# Patient Record
Sex: Female | Born: 1990 | Race: Black or African American | Hispanic: No | Marital: Single | Smoking: Never smoker
Health system: Southern US, Community
[De-identification: ages and names within clinical notes are randomized; demographics above are authoritative.]

## PROBLEM LIST (undated history)

## (undated) ENCOUNTER — Inpatient Hospital Stay (HOSPITAL_COMMUNITY): Payer: Self-pay

## (undated) DIAGNOSIS — C801 Malignant (primary) neoplasm, unspecified: Secondary | ICD-10-CM

---

## 2016-10-30 ENCOUNTER — Emergency Department (HOSPITAL_BASED_OUTPATIENT_CLINIC_OR_DEPARTMENT_OTHER)
Admission: EM | Admit: 2016-10-30 | Discharge: 2016-10-30 | Disposition: A | Discharge status: Home / Self Care | Payer: Medicaid Other | Attending: Emergency Medicine | Admitting: Emergency Medicine

## 2016-10-30 ENCOUNTER — Emergency Department (HOSPITAL_BASED_OUTPATIENT_CLINIC_OR_DEPARTMENT_OTHER): Payer: Medicaid Other

## 2016-10-30 ENCOUNTER — Encounter (HOSPITAL_BASED_OUTPATIENT_CLINIC_OR_DEPARTMENT_OTHER): Payer: Self-pay | Admitting: *Deleted

## 2016-10-30 DIAGNOSIS — O26892 Other specified pregnancy related conditions, second trimester: Secondary | ICD-10-CM | POA: Insufficient documentation

## 2016-10-30 DIAGNOSIS — Z859 Personal history of malignant neoplasm, unspecified: Secondary | ICD-10-CM | POA: Insufficient documentation

## 2016-10-30 DIAGNOSIS — R1030 Lower abdominal pain, unspecified: Secondary | ICD-10-CM | POA: Insufficient documentation

## 2016-10-30 DIAGNOSIS — Z3A22 22 weeks gestation of pregnancy: Secondary | ICD-10-CM | POA: Insufficient documentation

## 2016-10-30 DIAGNOSIS — N898 Other specified noninflammatory disorders of vagina: Secondary | ICD-10-CM | POA: Diagnosis not present

## 2016-10-30 HISTORY — DX: Malignant (primary) neoplasm, unspecified: C80.1

## 2016-10-30 LAB — CBC WITH DIFFERENTIAL/PLATELET
BASOS PCT: 0 %
Basophils Absolute: 0 10*3/uL (ref 0.0–0.1)
Eosinophils Absolute: 0.1 10*3/uL (ref 0.0–0.7)
Eosinophils Relative: 2 %
HEMATOCRIT: 35.4 % — AB (ref 36.0–46.0)
Hemoglobin: 12.3 g/dL (ref 12.0–15.0)
LYMPHS ABS: 1.4 10*3/uL (ref 0.7–4.0)
Lymphocytes Relative: 16 %
MCH: 31 pg (ref 26.0–34.0)
MCHC: 34.7 g/dL (ref 30.0–36.0)
MCV: 89.2 fL (ref 78.0–100.0)
MONO ABS: 0.6 10*3/uL (ref 0.1–1.0)
MONOS PCT: 8 %
NEUTROS ABS: 6.2 10*3/uL (ref 1.7–7.7)
Neutrophils Relative %: 74 %
Platelets: 263 10*3/uL (ref 150–400)
RBC: 3.97 MIL/uL (ref 3.87–5.11)
RDW: 12.9 % (ref 11.5–15.5)
WBC: 8.4 10*3/uL (ref 4.0–10.5)

## 2016-10-30 LAB — URINALYSIS, ROUTINE W REFLEX MICROSCOPIC
Bilirubin Urine: NEGATIVE
GLUCOSE, UA: NEGATIVE mg/dL
HGB URINE DIPSTICK: NEGATIVE
KETONES UR: NEGATIVE mg/dL
Leukocytes, UA: NEGATIVE
Nitrite: NEGATIVE
PH: 6 (ref 5.0–8.0)
Protein, ur: NEGATIVE mg/dL
Specific Gravity, Urine: 1.02 (ref 1.005–1.030)

## 2016-10-30 LAB — COMPREHENSIVE METABOLIC PANEL
ALK PHOS: 49 U/L (ref 38–126)
ALT: 11 U/L — AB (ref 14–54)
ANION GAP: 6 (ref 5–15)
AST: 20 U/L (ref 15–41)
Albumin: 3.6 g/dL (ref 3.5–5.0)
BILIRUBIN TOTAL: 0.4 mg/dL (ref 0.3–1.2)
BUN: <5 mg/dL — ABNORMAL LOW (ref 6–20)
CALCIUM: 8.7 mg/dL — AB (ref 8.9–10.3)
CO2: 21 mmol/L — ABNORMAL LOW (ref 22–32)
CREATININE: 0.49 mg/dL (ref 0.44–1.00)
Chloride: 107 mmol/L (ref 101–111)
Glucose, Bld: 83 mg/dL (ref 65–99)
Potassium: 3.4 mmol/L — ABNORMAL LOW (ref 3.5–5.1)
Sodium: 134 mmol/L — ABNORMAL LOW (ref 135–145)
TOTAL PROTEIN: 7 g/dL (ref 6.5–8.1)

## 2016-10-30 LAB — HCG, QUANTITATIVE, PREGNANCY: hCG, Beta Chain, Quant, S: 68061 m[IU]/mL — ABNORMAL HIGH (ref <5)

## 2016-10-30 NOTE — ED Notes (Signed)
Patient transported to Ultrasound 

## 2016-10-30 NOTE — Discharge Instructions (Signed)
Please read attached information regarding your condition. Follow-up at [facility] for your OB/GYN for further evaluation. Return to ED for worsening pain, vaginal bleeding, lightheadedness, loss of consciousness.

## 2016-10-30 NOTE — ED Notes (Signed)
Kathy from [facility] rapid response called and stated monitoring of mother and baby look good, no need for further monitoring at this time.

## 2016-10-30 NOTE — ED Triage Notes (Addendum)
She is [redacted] weeks pregnant. Here for abdominal cramping and leaking possible amnionic fluid since this am. She has not had prenatal care and been moving around from state to state trying to find medicaid coverage without success. G6-P3-M2

## 2016-10-30 NOTE — ED Provider Notes (Signed)
[place] DEPT [facility] Provider Note   CSN: [id number] Arrival date & time: 10/30/16  1937     History   Chief Complaint Chief Complaint  Patient presents with  . Abdominal Pain    HPI Monique Hayes is a 26 y.o. female.  HPI Patient, who is currently [redacted] weeks pregnant, presents to ED for evaluation of 1 week history of abdominal cramping. She also reports small leakage of fluid earlier today which has resolved. She states she received prenatal care about 2 months ago an ultrasound was completed there which showed intrauterine pregnancy. She also reports vaginal discharge, vaginal bleeding, nausea. She denies any dysuria, chest pain, trouble breathing, headache, vision changes, leg swelling, recent falls or injuries.  Past Medical History:  Diagnosis Date  . Cancer ([place])     There are no active problems to display for this patient.   History reviewed. No pertinent surgical history.  OB History    Gravida Para Term Preterm AB Living   1             SAB TAB Ectopic Multiple Live Births                   Home Medications    Prior to Admission medications   Not on File    Family History No family history on file.  Social History Social History  Substance Use Topics  . Smoking status: Never Smoker  . Smokeless tobacco: Never Used  . Alcohol use No     Allergies   Patient has no known allergies.   Review of Systems Review of Systems  Constitutional: Negative for appetite change, chills and fever.  HENT: Negative for ear pain, rhinorrhea, sneezing and sore throat.   Eyes: Negative for photophobia and visual disturbance.  Respiratory: Negative for cough, chest tightness, shortness of breath and wheezing.   Cardiovascular: Negative for chest pain and palpitations.  Gastrointestinal: Positive for abdominal pain, diarrhea and nausea. Negative for blood in stool, constipation and vomiting.  Genitourinary: Positive for vaginal discharge. Negative for  dysuria, enuresis, hematuria, urgency, vaginal bleeding and vaginal pain.  Musculoskeletal: Negative for myalgias.  Skin: Negative for rash.  Neurological: Negative for dizziness, weakness and light-headedness.     Physical Exam Updated Vital Signs BP 102/63 (BP Location: Left Arm)   Pulse 89   Temp 98.1 F (36.7 C) (Oral)   Resp 16   Ht 5\' 6"  (1.676 m)   Wt 86.2 kg (190 lb)   SpO2 100%   BMI 30.67 kg/m   Physical Exam  Constitutional: She appears well-developed and well-nourished. No distress.  Fetal heart rate in 140s on monitor.  HENT:  Head: Normocephalic and atraumatic.  Nose: Nose normal.  Eyes: Conjunctivae and EOM are normal. Left eye exhibits no discharge. No scleral icterus.  Neck: Normal range of motion. Neck supple.  Cardiovascular: Normal rate, regular rhythm, normal heart sounds and intact distal pulses.  Exam reveals no gallop and no friction rub.   No murmur heard. Pulmonary/Chest: Effort normal and breath sounds normal. No respiratory distress.  Abdominal: Soft. Bowel sounds are normal. She exhibits no distension. There is tenderness. There is no guarding.    Gravid uterus.  Musculoskeletal: Normal range of motion. She exhibits no edema.  Neurological: She is alert. She exhibits normal muscle tone. Coordination normal.  Skin: Skin is warm and dry. No rash noted.  Psychiatric: She has a normal mood and affect.  Nursing note and vitals reviewed.    ED  Treatments / Results  Labs (all labs ordered are listed, but only abnormal results are displayed) Labs Reviewed  HCG, QUANTITATIVE, PREGNANCY - Abnormal; Notable for the following:       Result Value   hCG, Beta Chain, Quant, S 68,061 (*)    All other components within normal limits  CBC WITH DIFFERENTIAL/PLATELET - Abnormal; Notable for the following:    HCT 35.4 (*)    All other components within normal limits  COMPREHENSIVE METABOLIC PANEL - Abnormal; Notable for the following:    Sodium 134 (*)      Potassium 3.4 (*)    CO2 21 (*)    BUN <5 (*)    Calcium 8.7 (*)    ALT 11 (*)    All other components within normal limits  URINALYSIS, ROUTINE W REFLEX MICROSCOPIC  ABO/RH    EKG  EKG Interpretation None       Radiology US Ob Limited > 14 Wks  Result Date: 10/30/2016 CLINICAL DATA:  Pregnant patient in second trimester with abdominal cramping today. EXAM: LIMITED OBSTETRIC ULTRASOUND FINDINGS: Number of Fetuses: 1 Heart Rate:  155 bpm Movement: Yes Presentation: Cephalic Placental Location: Anterior Previa: No. Amniotic Fluid (Subjective):  Within normal limits. BPD:  5.47cm 22w  5d MATERNAL FINDINGS: Cervix:  Appears closed. Uterus/Adnexae: No abnormality visualized. IMPRESSION: Single live intrauterine pregnancy gestational age [redacted] weeks 5 days, estimated date of delivery 02/27/2017. This exam is performed on an emergent basis and does not comprehensively evaluate fetal size, dating, or anatomy; follow-up complete OB US should be considered if further fetal assessment is warranted. Electronically Signed   By: Jeb Levering M.D.   On: 10/30/2016 21:59    Procedures Procedures (including critical care time)  Medications Ordered in ED Medications - No data to display   Initial Impression / Assessment and Plan / ED Course  I have reviewed the triage vital signs and the nursing notes.  Pertinent labs & imaging results that were available during my care of the patient were reviewed by me and considered in my medical decision making (see chart for details).     Patient, who is currently [redacted] weeks pregnant, presents to ED for evaluation of lower abdominal pain and slight leakage of fluids earlier today. Abdominal pain has been going on for the past week. She only received prenatal care about 2 months ago and was told she had a normal IUP. She denies any vaginal bleeding, fevers, headache, vision changes or extremity swelling. She is afebrile with normal vital signs. She is  nontoxic appearing and in no acute distress. There is a gravid uterus present. Fetal heart tones noted in 140s here in the ED on monitor. Lab work including CBC, CMP, urinalysis unremarkable. No signs of UTI or dehydration. Normal blood pressure noted here. Ultrasound done here shows normal IUP with EGA of [redacted] weeks and 5 days, no evidence of placental abruption or other abnormalities. Patient encouraged to follow-up at [facility] or her OB/GYN for further prenatal care. The patient appears stable for discharge at this time. Strict return precautions given.  Final Clinical Impressions(s) / ED Diagnoses   Final diagnoses:  [redacted] weeks gestation of pregnancy    New Prescriptions New Prescriptions   No medications on file     Delia Heady, Hershal Coria 10/31/16 0017    Charlesetta Shanks, MD 11/06/16 [phone]

## 2016-10-30 NOTE — ED Notes (Signed)
ED Provider at bedside. 

## 2016-10-31 LAB — ABO/RH: ABO/RH(D): O POS

## 2016-11-16 ENCOUNTER — Emergency Department (HOSPITAL_BASED_OUTPATIENT_CLINIC_OR_DEPARTMENT_OTHER)
Admission: EM | Admit: 2016-11-16 | Discharge: 2016-11-16 | Disposition: A | Discharge status: Home / Self Care | Payer: Medicaid Other | Attending: Physician Assistant | Admitting: Physician Assistant

## 2016-11-16 ENCOUNTER — Encounter: Payer: Self-pay | Admitting: Obstetrics and Gynecology

## 2016-11-16 ENCOUNTER — Encounter (HOSPITAL_BASED_OUTPATIENT_CLINIC_OR_DEPARTMENT_OTHER): Payer: Self-pay

## 2016-11-16 DIAGNOSIS — Z3A24 24 weeks gestation of pregnancy: Secondary | ICD-10-CM | POA: Diagnosis not present

## 2016-11-16 DIAGNOSIS — O0932 Supervision of pregnancy with insufficient antenatal care, second trimester: Secondary | ICD-10-CM | POA: Insufficient documentation

## 2016-11-16 DIAGNOSIS — O9989 Other specified diseases and conditions complicating pregnancy, childbirth and the puerperium: Secondary | ICD-10-CM | POA: Diagnosis present

## 2016-11-16 DIAGNOSIS — Z859 Personal history of malignant neoplasm, unspecified: Secondary | ICD-10-CM | POA: Insufficient documentation

## 2016-11-16 DIAGNOSIS — O23592 Infection of other part of genital tract in pregnancy, second trimester: Secondary | ICD-10-CM | POA: Diagnosis not present

## 2016-11-16 DIAGNOSIS — B9689 Other specified bacterial agents as the cause of diseases classified elsewhere: Secondary | ICD-10-CM

## 2016-11-16 DIAGNOSIS — N760 Acute vaginitis: Secondary | ICD-10-CM

## 2016-11-16 LAB — COMPREHENSIVE METABOLIC PANEL
ALK PHOS: 57 U/L (ref 38–126)
ALT: 10 U/L — AB (ref 14–54)
ANION GAP: 7 (ref 5–15)
AST: 16 U/L (ref 15–41)
Albumin: 3 g/dL — ABNORMAL LOW (ref 3.5–5.0)
BILIRUBIN TOTAL: 0.5 mg/dL (ref 0.3–1.2)
BUN: <5 mg/dL — ABNORMAL LOW (ref 6–20)
CALCIUM: 8.7 mg/dL — AB (ref 8.9–10.3)
CO2: 22 mmol/L (ref 22–32)
CREATININE: 0.42 mg/dL — AB (ref 0.44–1.00)
Chloride: 106 mmol/L (ref 101–111)
Glucose, Bld: 85 mg/dL (ref 65–99)
Potassium: 3.3 mmol/L — ABNORMAL LOW (ref 3.5–5.1)
SODIUM: 135 mmol/L (ref 135–145)
TOTAL PROTEIN: 6.5 g/dL (ref 6.5–8.1)

## 2016-11-16 LAB — URINALYSIS, ROUTINE W REFLEX MICROSCOPIC
Bilirubin Urine: NEGATIVE
Glucose, UA: NEGATIVE mg/dL
Hgb urine dipstick: NEGATIVE
Ketones, ur: NEGATIVE mg/dL
NITRITE: NEGATIVE
PH: 6 (ref 5.0–8.0)
Protein, ur: NEGATIVE mg/dL
SPECIFIC GRAVITY, URINE: 1.01 (ref 1.005–1.030)

## 2016-11-16 LAB — CBC WITH DIFFERENTIAL/PLATELET
Basophils Absolute: 0 10*3/uL (ref 0.0–0.1)
Basophils Relative: 0 %
Eosinophils Absolute: 0.1 10*3/uL (ref 0.0–0.7)
Eosinophils Relative: 1 %
HCT: 32.2 % — ABNORMAL LOW (ref 36.0–46.0)
Hemoglobin: 11.3 g/dL — ABNORMAL LOW (ref 12.0–15.0)
Lymphocytes Relative: 14 %
Lymphs Abs: 1.4 10*3/uL (ref 0.7–4.0)
MCH: 31.3 pg (ref 26.0–34.0)
MCHC: 35.1 g/dL (ref 30.0–36.0)
MCV: 89.2 fL (ref 78.0–100.0)
Monocytes Absolute: 0.8 10*3/uL (ref 0.1–1.0)
Monocytes Relative: 8 %
Neutro Abs: 7.9 10*3/uL — ABNORMAL HIGH (ref 1.7–7.7)
Neutrophils Relative %: 77 %
Platelets: 244 10*3/uL (ref 150–400)
RBC: 3.61 MIL/uL — ABNORMAL LOW (ref 3.87–5.11)
RDW: 12.8 % (ref 11.5–15.5)
WBC: 10.2 10*3/uL (ref 4.0–10.5)

## 2016-11-16 LAB — WET PREP, GENITAL
Sperm: NONE SEEN
TRICH WET PREP: NONE SEEN
YEAST WET PREP: NONE SEEN

## 2016-11-16 LAB — URINALYSIS, MICROSCOPIC (REFLEX): RBC / HPF: NONE SEEN RBC/hpf (ref 0–5)

## 2016-11-16 MED ORDER — ACETAMINOPHEN 325 MG PO TABS
650.0000 mg | ORAL_TABLET | Freq: Once | ORAL | Status: AC
  Administered 2016-11-16: 650 mg via ORAL
  Filled 2016-11-16: qty 2

## 2016-11-16 MED ORDER — CEPHALEXIN 500 MG PO CAPS: 500.0000 mg | ORAL_CAPSULE | Freq: Three times a day (TID) | ORAL | 0 refills | Status: AC

## 2016-11-16 MED ORDER — CEPHALEXIN 250 MG PO CAPS
500.0000 mg | ORAL_CAPSULE | Freq: Once | ORAL | Status: AC
  Administered 2016-11-16: 500 mg via ORAL
  Filled 2016-11-16: qty 2

## 2016-11-16 MED ORDER — METRONIDAZOLE 500 MG PO TABS: 500.0000 mg | ORAL_TABLET | Freq: Two times a day (BID) | ORAL | 0 refills | Status: DC

## 2016-11-16 MED ORDER — METRONIDAZOLE 500 MG PO TABS
500.0000 mg | ORAL_TABLET | Freq: Once | ORAL | Status: AC
  Administered 2016-11-16: 500 mg via ORAL
  Filled 2016-11-16: qty 1

## 2016-11-16 NOTE — Progress Notes (Signed)
Received call from [employer], [facility] Pt is a G6P4 at 25 1/[redacted] weeks gestation with c/o abd pain. Says pt denies vaginal bleeding or a gush of fluid. Says pt has not been getting regular PNC.Says pt has had all vaginal deliveries.

## 2016-11-16 NOTE — ED Notes (Signed)
Spoke with Stanton Kidney at [facility] for fetal monitoring, placed on TOCO fetal monitor

## 2016-11-16 NOTE — Discharge Instructions (Signed)
Please use warm compresses a couple times a day on the draining abscess. You can also take these antibiotics to treat your a bacterial vaginosis. Please follow-up with OB/GYN at [facility]. [facility] given them your phone number to call.

## 2016-11-16 NOTE — Progress Notes (Signed)
Spoke with Dr. Glo Herring. Pt is a G6P4, all vaginal deliveries at 25 1/[redacted] weeks gestation presenting at [facility] with c/o lower abd pain and cramping. Pt has had limited PNC. Had an ultrasound on 10/30/2016. Pt told [facility] RN that she has had an earlier ultrasound. FHR tracing is a category 1 with no uc's. There are orders for GC, Clamydia, and wet prep. Dr. Glo Herring wants pt to have an UA and a CBC. Also wants Dr. Thomasene Lot to call him so they can discuss a plan of care.

## 2016-11-16 NOTE — Progress Notes (Signed)
Spoke with Dr. Glo Herring. Pt has been d/c. Says he did talk to the ED MD and the pt has been put on the priority list for an OB appointment with [facility].

## 2016-11-16 NOTE — ED Triage Notes (Signed)
Pt is 24-[redacted] weeks pregnant. C/o lower abdominal pain and cramping. Also sts discharge. Unsure if possible contractions. No prenatal care. G6-P3-SA2

## 2016-11-16 NOTE — ED Provider Notes (Signed)
[place] DEPT [facility] Provider Note   CSN: [id number] Arrival date & time: 11/16/16  1211     History   Chief Complaint Chief Complaint  Patient presents with  . Abdominal Pain  . Vaginal Discharge    HPI Monique Hayes is a 26 y.o. female.  HPI   Patient is a 26 year old female G6 P3 [redacted] weeks pregnant presenting today with continued lower abdominal pain. Patient had very spotty prenatal care because she's been moving between [place] in [place]. Patient was seen 2 weeks ago in our emergency department for exactly the same complaint. Patient reports continued lower abdominal pain that is constant in nature. She reports that she had no vaginal exam done at that time. She denies any unprotected sex in the last several weeks.  She reports that she called women's was unable to make an appointment until October 22.   Past Medical History:  Diagnosis Date  . Cancer [facility])     Patient Active Problem List   Diagnosis Date Noted  . No prenatal care in current pregnancy in second trimester 11/16/2016    History reviewed. No pertinent surgical history.  OB History    Gravida Para Term Preterm AB Living   1             SAB TAB Ectopic Multiple Live Births                   Home Medications    Prior to Admission medications   Not on File    Family History No family history on file.  Social History Social History  Substance Use Topics  . Smoking status: Never Smoker  . Smokeless tobacco: Never Used  . Alcohol use No     Allergies   Patient has no known allergies.   Review of Systems Review of Systems  Constitutional: Negative for activity change.  Respiratory: Negative for shortness of breath.   Cardiovascular: Negative for chest pain.  Gastrointestinal: Negative for abdominal pain.     Physical Exam Updated Vital Signs BP 105/65   Pulse 83   Temp 98.6 F (37 C) (Oral)   Resp 18   Ht 5\' 6"  (1.676 m)   Wt 86.2 kg (190 lb)   SpO2  99%   BMI 30.67 kg/m   Physical Exam  Constitutional: She is oriented to person, place, and time. She appears well-developed and well-nourished.  HENT:  Head: Normocephalic and atraumatic.  Eyes: Right eye exhibits no discharge.  Cardiovascular: Normal rate, regular rhythm and normal heart sounds.   No murmur heard. Pulmonary/Chest: Effort normal and breath sounds normal. She has no wheezes. She has no rales.  Abdominal: Soft. She exhibits no distension. There is no tenderness.  Gravid abdomen  Mild tenderness bilaterally  Genitourinary:  Genitourinary Comments: Indurated area left pubis area,  actively draining. Draining abscsess to superficial area of mons pubis  Neurological: She is oriented to person, place, and time.  Skin: Skin is warm and dry. She is not diaphoretic.  Psychiatric: She has a normal mood and affect.  Nursing note and vitals reviewed.    ED Treatments / Results  Labs (all labs ordered are listed, but only abnormal results are displayed) Labs Reviewed  WET PREP, GENITAL - Abnormal; Notable for the following:       Result Value   Clue Cells Wet Prep HPF POC PRESENT (*)    WBC, Wet Prep HPF POC MANY (*)    All other components within  normal limits  URINALYSIS, ROUTINE W REFLEX MICROSCOPIC - Abnormal; Notable for the following:    Leukocytes, UA SMALL (*)    All other components within normal limits  URINALYSIS, MICROSCOPIC (REFLEX) - Abnormal; Notable for the following:    Bacteria, UA MANY (*)    Squamous Epithelial / LPF 0-5 (*)    All other components within normal limits  COMPREHENSIVE METABOLIC PANEL - Abnormal; Notable for the following:    Potassium 3.3 (*)    BUN <5 (*)    Creatinine, Ser 0.42 (*)    Calcium 8.7 (*)    Albumin 3.0 (*)    ALT 10 (*)    All other components within normal limits  CBC WITH DIFFERENTIAL/PLATELET - Abnormal; Notable for the following:    RBC 3.61 (*)    Hemoglobin 11.3 (*)    HCT 32.2 (*)    Neutro Abs 7.9 (*)     All other components within normal limits  GC/CHLAMYDIA PROBE AMP ([place]) NOT AT [facility]    EKG  EKG Interpretation None       Radiology No results found.  Procedures Procedures (including critical care time)  Medications Ordered in ED Medications  acetaminophen (TYLENOL) tablet 650 mg (650 mg Oral Given 11/16/16 1320)     Initial Impression / Assessment and Plan / ED Course  I have reviewed the triage vital signs and the nursing notes.  Pertinent labs & imaging results that were available during my care of the patient were reviewed by me and considered in my medical decision making (see chart for details).      Patient is a 26 year old female G6 P3 [redacted] weeks pregnant presenting today with continued lower abdominal pain. Patient had very spotty prenatal care because she's been moving between [place] in [place]. Patient was seen 2 weeks ago in our emergency department for exactly the same complaint. Patient reports continued lower abdominal pain that is constant in nature. She reports that she had no vaginal exam done at that time. She denies any unprotected sex in the last several weeks.  She reports that she called women's was unable to make an appointment until October 22.   1:19 PM  Given that it spent about 4 weeks of this pain, doubtt any acute intra-abdominal pathology. We'll do vaginal exam.  3:02 PM Patient is nonspecific pain diffusely vaginally and abdominally. Patient's  positive BV. It is likely the cause of her discomfort. Patient also has abscess that is currently draining with no surrounding cellulitis. We will give a short course of Keflex. Discussed with OB/GYN provider since she says she cannot be k seen until October 22. He and I both agreed a sooner appointment would be more reasonable. He will have the administrators contact her in the next 72 hours to schedule an appointment.  Patient looked good on toco. Normal vital signs, taking PO  at discharge.   Final Clinical Impressions(s) / ED Diagnoses   Final diagnoses:  None    New Prescriptions New Prescriptions   No medications on file     Macarthur Critchley, MD 11/16/16 1502

## 2016-11-16 NOTE — Progress Notes (Signed)
Spoke with Ocean[facility]. Dr. Glo Herring wants Dr. Thomasene Lot to call him at [phone] to discuss pt's plan of care. Sophie says that a urine has already been done and has resulted. Says she will put in orders for a CBC and she will tell Dr. Thomasene Lot to call Dr. Glo Herring.

## 2016-11-16 NOTE — ED Notes (Signed)
Pt attempting to obtain urine specimen at this time.

## 2016-11-16 NOTE — ED Notes (Addendum)
Hot compress given for boil/abcess per RN.

## 2016-11-16 NOTE — ED Notes (Signed)
Pt remains on auto VS

## 2016-11-16 NOTE — ED Notes (Signed)
Pt placed on toco monitor. FHT 148

## 2016-11-16 NOTE — ED Notes (Signed)
Meal given. Water to drink.

## 2016-11-16 NOTE — Progress Notes (Signed)
Spoke with Mahala Menghini. ED MD is getting a hx from the pt now in order to develop a plan of care.

## 2016-11-16 NOTE — ED Notes (Signed)
Ginger ale given

## 2016-11-16 NOTE — ED Notes (Signed)
Pt on auto VS  

## 2016-11-16 NOTE — ED Notes (Addendum)
States has been having abd pain x 2 weeks or greater. Denies vag bleeding but having white vag d/c .

## 2016-11-16 NOTE — ED Notes (Signed)
ED Provider at bedside. 

## 2016-11-16 NOTE — ED Notes (Signed)
Informed that could have a regular diet, frozen meal being prepared

## 2016-11-17 LAB — GC/CHLAMYDIA PROBE AMP (~~LOC~~) NOT AT ARMC
Chlamydia: NEGATIVE
Neisseria Gonorrhea: NEGATIVE

## 2016-11-27 ENCOUNTER — Inpatient Hospital Stay (HOSPITAL_COMMUNITY)
Admission: AD | Admit: 2016-11-27 | Discharge: 2016-11-27 | Disposition: A | Discharge status: Home / Self Care | Payer: Medicaid Other | Source: Ambulatory Visit | Attending: Obstetrics & Gynecology | Admitting: Obstetrics & Gynecology

## 2016-11-27 ENCOUNTER — Encounter (HOSPITAL_COMMUNITY): Payer: Self-pay

## 2016-11-27 DIAGNOSIS — O99512 Diseases of the respiratory system complicating pregnancy, second trimester: Secondary | ICD-10-CM | POA: Diagnosis not present

## 2016-11-27 DIAGNOSIS — B9789 Other viral agents as the cause of diseases classified elsewhere: Secondary | ICD-10-CM

## 2016-11-27 DIAGNOSIS — N760 Acute vaginitis: Secondary | ICD-10-CM

## 2016-11-27 DIAGNOSIS — O23592 Infection of other part of genital tract in pregnancy, second trimester: Secondary | ICD-10-CM | POA: Insufficient documentation

## 2016-11-27 DIAGNOSIS — B9689 Other specified bacterial agents as the cause of diseases classified elsewhere: Secondary | ICD-10-CM | POA: Diagnosis not present

## 2016-11-27 DIAGNOSIS — J069 Acute upper respiratory infection, unspecified: Secondary | ICD-10-CM | POA: Diagnosis not present

## 2016-11-27 DIAGNOSIS — O9989 Other specified diseases and conditions complicating pregnancy, childbirth and the puerperium: Secondary | ICD-10-CM

## 2016-11-27 DIAGNOSIS — Z3A26 26 weeks gestation of pregnancy: Secondary | ICD-10-CM | POA: Diagnosis not present

## 2016-11-27 DIAGNOSIS — J029 Acute pharyngitis, unspecified: Secondary | ICD-10-CM | POA: Diagnosis present

## 2016-11-27 LAB — POCT FERN TEST: POCT Fern Test: NEGATIVE

## 2016-11-27 LAB — URINALYSIS, ROUTINE W REFLEX MICROSCOPIC
Bilirubin Urine: NEGATIVE
GLUCOSE, UA: NEGATIVE mg/dL
HGB URINE DIPSTICK: NEGATIVE
Ketones, ur: NEGATIVE mg/dL
Leukocytes, UA: NEGATIVE
Nitrite: NEGATIVE
Protein, ur: NEGATIVE mg/dL
SPECIFIC GRAVITY, URINE: 1.009 (ref 1.005–1.030)
pH: 6 (ref 5.0–8.0)

## 2016-11-27 MED ORDER — METRONIDAZOLE 500 MG PO TABS: 500.0000 mg | ORAL_TABLET | Freq: Two times a day (BID) | ORAL | 0 refills | Status: AC

## 2016-11-27 NOTE — Discharge Instructions (Signed)
Bacterial Vaginosis Bacterial vaginosis is an infection of the vagina. It happens when too many germs (bacteria) grow in the vagina. This infection puts you at risk for infections from sex (STIs). Treating this infection can lower your risk for some STIs. You should also treat this if you are pregnant. It can cause your baby to be born early. Follow these instructions at home: Medicines  Take over-the-counter and prescription medicines only as told by your doctor.  Take or use your antibiotic medicine as told by your doctor. Do not stop taking or using it even if you start to feel better. General instructions  If you your sexual partner is a woman, tell her that you have this infection. She needs to get treatment if she has symptoms. If you have a female partner, he does not need to be treated.  During treatment: ? Avoid sex. ? Do not douche. ? Avoid alcohol as told. ? Avoid breastfeeding as told.  Drink enough fluid to keep your pee (urine) clear or pale yellow.  Keep your vagina and butt (rectum) clean. ? Wash the area with warm water every day. ? Wipe from front to back after you use the toilet.  Keep all follow-up visits as told by your doctor. This is important. Preventing this condition  Do not douche.  Use only warm water to wash around your vagina.  Use protection when you have sex. This includes: ? Latex condoms. ? Dental dams.  Limit how many people you have sex with. It is best to only have sex with the same person (be monogamous).  Get tested for STIs. Have your partner get tested.  Wear underwear that is cotton or lined with cotton.  Avoid tight pants and pantyhose. This is most important in summer.  Do not use any products that have nicotine or tobacco in them. These include cigarettes and e-cigarettes. If you need help quitting, ask your doctor.  Do not use illegal drugs.  Limit how much alcohol you drink. Contact a doctor if:  Your symptoms do not get  better, even after you are treated.  You have more discharge or pain when you pee (urinate).  You have a fever.  You have pain in your belly (abdomen).  You have pain with sex.  Your bleed from your vagina between periods. Summary  This infection happens when too many germs (bacteria) grow in the vagina.  Treating this condition can lower your risk for some infections from sex (STIs).  You should also treat this if you are pregnant. It can cause early (premature) birth.  Do not stop taking or using your antibiotic medicine even if you start to feel better. This information is not intended to replace advice given to you by your health care provider. Make sure you discuss any questions you have with your health care provider. Document Released: 11/20/2007 Document Revised: 10/27/2015 Document Reviewed: 10/27/2015 Elsevier Interactive Patient Education  2017 [employer].  Upper Respiratory Infection, Adult Most upper respiratory infections (URIs) are caused by a virus. A URI affects the nose, throat, and upper air passages. The most common type of URI is often called "the common cold." Follow these instructions at home:  Take medicines only as told by your doctor.  Gargle warm saltwater or take cough drops to comfort your throat as told by your doctor.  Use a warm mist humidifier or inhale steam from a shower to increase air moisture. This may make it easier to breathe.  Drink enough fluid  to keep your pee (urine) clear or pale yellow.  Eat soups and other clear broths.  Have a healthy diet.  Rest as needed.  Go back to work when your fever is gone or your doctor says it is okay. ? You may need to stay home longer to avoid giving your URI to others. ? You can also wear a face mask and wash your hands often to prevent spread of the virus.  Use your inhaler more if you have asthma.  Do not use any tobacco products, including cigarettes, chewing tobacco, or electronic  cigarettes. If you need help quitting, ask your doctor. Contact a doctor if:  You are getting worse, not better.  Your symptoms are not helped by medicine.  You have chills.  You are getting more short of breath.  You have brown or red mucus.  You have yellow or brown discharge from your nose.  You have pain in your face, especially when you bend forward.  You have a fever.  You have puffy (swollen) neck glands.  You have pain while swallowing.  You have white areas in the back of your throat. Get help right away if:  You have very bad or constant: ? Headache. ? Ear pain. ? Pain in your forehead, behind your eyes, and over your cheekbones (sinus pain). ? Chest pain.  You have long-lasting (chronic) lung disease and any of the following: ? Wheezing. ? Long-lasting cough. ? Coughing up blood. ? A change in your usual mucus.  You have a stiff neck.  You have changes in your: ? Vision. ? Hearing. ? Thinking. ? Mood. This information is not intended to replace advice given to you by your health care provider. Make sure you discuss any questions you have with your health care provider. Document Released: 07/30/2007 Document Revised: 10/14/2015 Document Reviewed: 05/18/2013 Elsevier Interactive Patient Education  2018 [employer].

## 2016-11-27 NOTE — Progress Notes (Signed)
E signature page not working at this time.  Patient signed paper copy of AVS.

## 2016-11-27 NOTE — MAU Provider Note (Signed)
History     CSN: [id number]  Arrival date and time: 11/27/16 1441   First Provider Initiated Contact with Patient 11/27/16 1548      Chief Complaint  Patient presents with  . Abdominal Pain  . URI   HPI  [name] is a 26 y.o. [id number] at [redacted]w[redacted]d who presents to MAU today with complaint of URI symptoms since Monday. She states cough, congestions, sore throat, body aches and headache. She has not taken anything for her symptoms. She denies fever, N/V/D or constipation, contractions, vaginal bleeding today. She was seen in the ED recently and diagnosed with BV and did not take her medication. She complains of possible LOF today. She has moved here from [place] and has had a lapse in prenatal care since June.   OB History    Gravida Para Term Preterm AB Living   6       2 3    SAB TAB Ectopic Multiple Live Births   2              Past Medical History:  Diagnosis Date  . Cancer [facility])     History reviewed. No pertinent surgical history.  Family History  Problem Relation Age of Onset  . Hypertension Father     Social History  Substance Use Topics  . Smoking status: Never Smoker  . Smokeless tobacco: Never Used  . Alcohol use Yes     Comment: occasionally before the pregnancy     Allergies: No Known Allergies  No prescriptions prior to admission.    Review of Systems  Constitutional: Negative for fever.  HENT: Positive for congestion, rhinorrhea, sinus pressure and sore throat.   Respiratory: Positive for cough.   Gastrointestinal: Negative for abdominal pain, constipation, diarrhea, nausea and vomiting.  Genitourinary: Positive for vaginal discharge. Negative for dysuria, frequency, urgency and vaginal bleeding.   Physical Exam   Blood pressure 123/71, pulse (!) 108, temperature 98.5 F (36.9 C), temperature source Oral, resp. rate 18, weight 183 lb 0.6 oz (83 kg), SpO2 100 %.  Physical Exam  Nursing note and vitals reviewed. Constitutional:  She is oriented to person, place, and time. She appears well-developed and well-nourished. No distress.  HENT:  Head: Normocephalic and atraumatic.  Cardiovascular: Normal rate.   Respiratory: Effort normal.  GI: Soft. She exhibits no distension and no mass. There is no tenderness. There is no rebound and no guarding.  Genitourinary: Uterus is enlarged (appropriate for [place]). Uterus is not tender. Cervix exhibits no motion tenderness. No bleeding in the vagina. Injury: moderate thin, white. Vaginal discharge found.  Neurological: She is alert and oriented to person, place, and time.  Skin: Skin is warm and dry. No erythema.  Psychiatric: She has a normal mood and affect.    Results for orders placed or performed during the hospital encounter of 11/27/16 (from the past 24 hour(s))  Urinalysis, Routine w reflex microscopic     Status: None   Collection Time: 11/27/16  3:14 PM  Result Value Ref Range   Color, Urine YELLOW YELLOW   APPearance CLEAR CLEAR   Specific Gravity, Urine 1.009 1.005 - 1.030   pH 6.0 5.0 - 8.0   Glucose, UA NEGATIVE NEGATIVE mg/dL   Hgb urine dipstick NEGATIVE NEGATIVE   Bilirubin Urine NEGATIVE NEGATIVE   Ketones, ur NEGATIVE NEGATIVE mg/dL   Protein, ur NEGATIVE NEGATIVE mg/dL   Nitrite NEGATIVE NEGATIVE   Leukocytes, UA NEGATIVE NEGATIVE  [employer]  Status: None   Collection Time: 11/27/16  4:30 PM  Result Value Ref Range   POCT Fern Test Negative = intact amniotic membranes    Fetal Monitoring: Baseline: 130 bpm Variability: moderate Accelerations: 10 x 10, 15 x 15 (x1) Decelerations: none Contractions: none  MAU Course  Procedures None  MDM UA today  Fern - negative Discharge consistent with previously diagnosed BV. Will treat today.   Assessment and Plan  A: SIUP at [redacted]w[redacted]d Bacterial vaginosis Viral URI Lapse in prenatal care  P: Discharge home Rx for Flagyl given to patient  Preterm labor precautions discussed Patient advised to  follow-up with [facility], they will call her with an appointment Patient may return to MAU as needed or if her condition were to change or worsen  Kerry Hough, PA-C 11/27/2016, 5:34 PM

## 2016-11-27 NOTE — MAU Note (Signed)
States has a "bad cold" since monday Congestion Sneezing Cough--non productive Sore throat Denies fever or chills Has not tried anything over the counter  +lower abdominal pain Rating pain 8/10 Sharp/aches Constant pain Has been seen for this before and was given antibiotics but has been unable to fill that due to insurance and cost.

## 2016-12-08 ENCOUNTER — Encounter: Payer: Self-pay | Admitting: Obstetrics and Gynecology

## 2017-05-23 ENCOUNTER — Encounter (HOSPITAL_COMMUNITY): Payer: Self-pay

## 2018-10-12 IMAGING — US US OB LIMITED
1 series · 10 of 10 positions shown · non-contrast
Comparison: none

CLINICAL DATA: Pregnant patient in second trimester with abdominal
cramping today.

EXAM:
LIMITED OBSTETRIC ULTRASOUND

[Series 1: us ob limited · 0.17mm/px · 10 acquisitions, 10 frames shown]
[im 1/10]
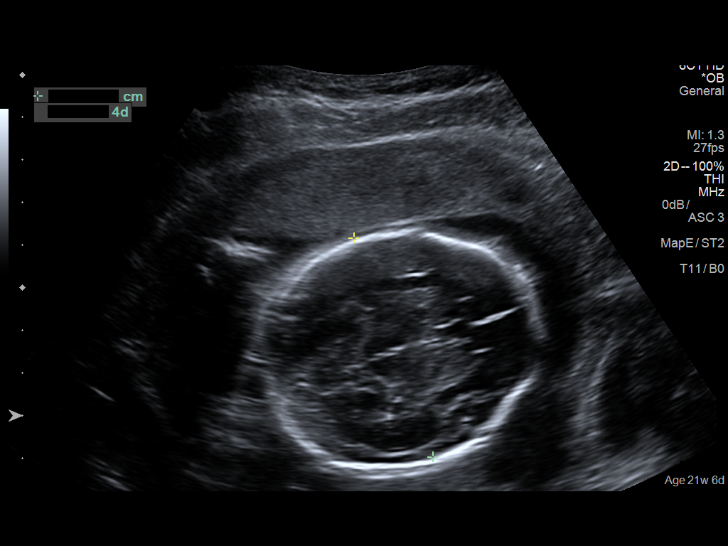
[im 2/10]
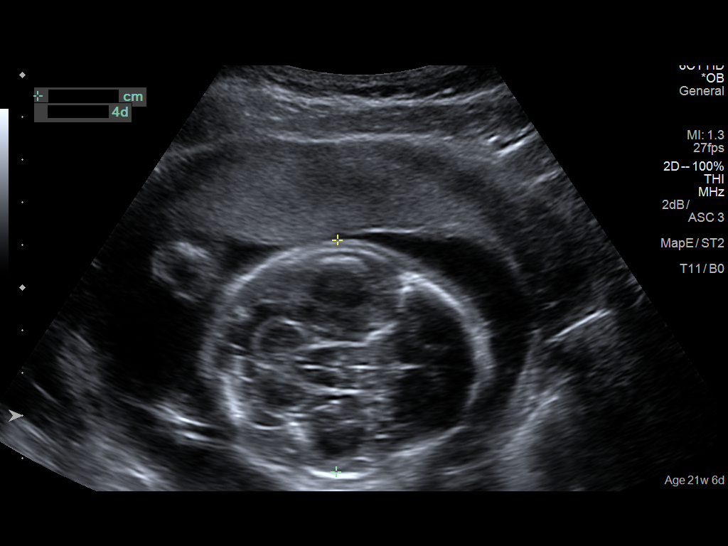
[im 3/10]
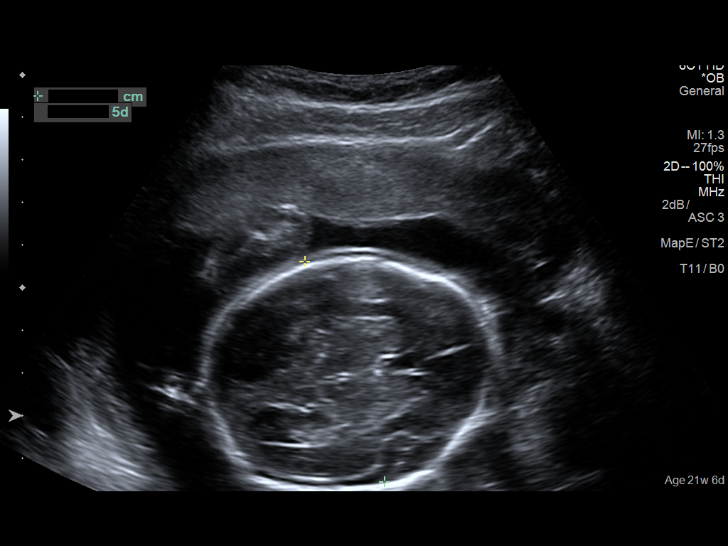
[im 4/10]
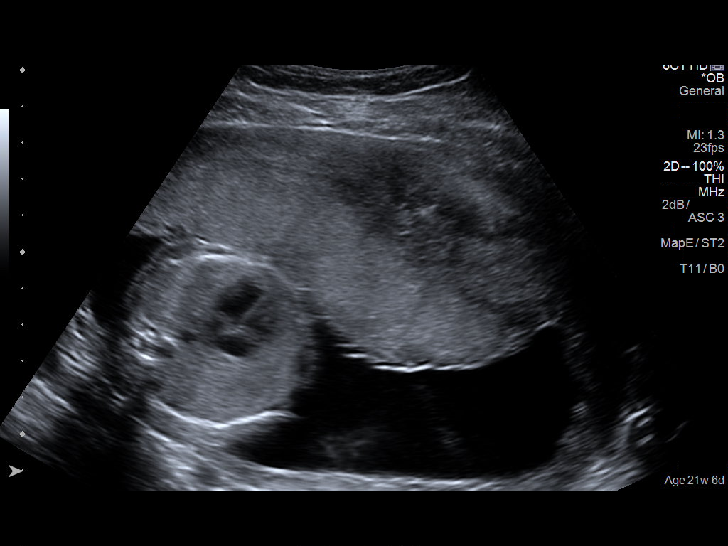
[im 5/10]
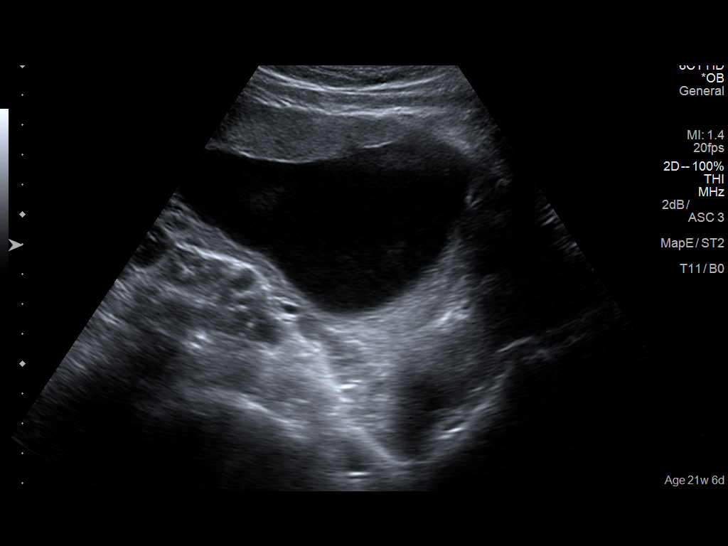
[im 6/10]
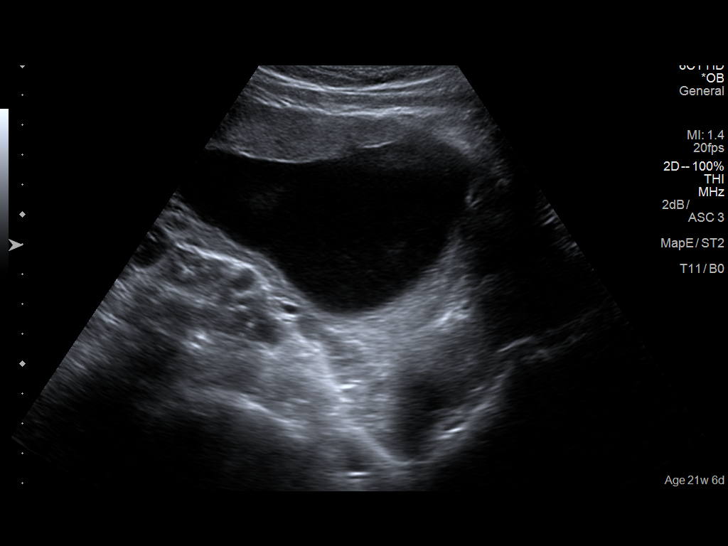
[im 7/10]
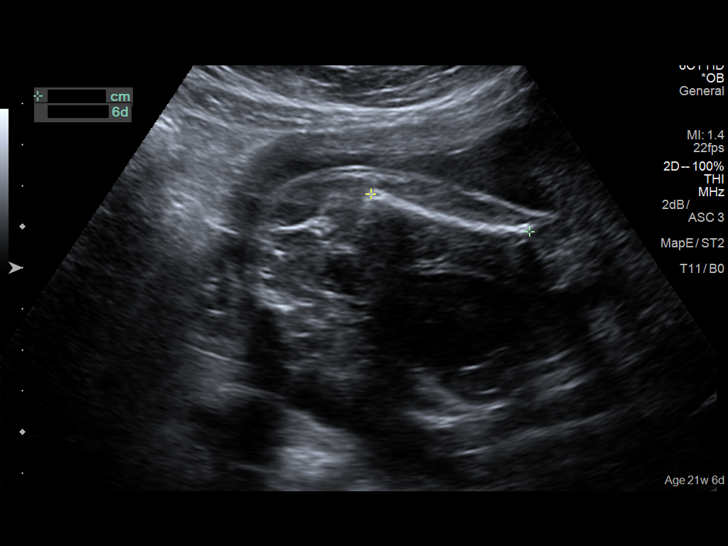
[im 8/10]
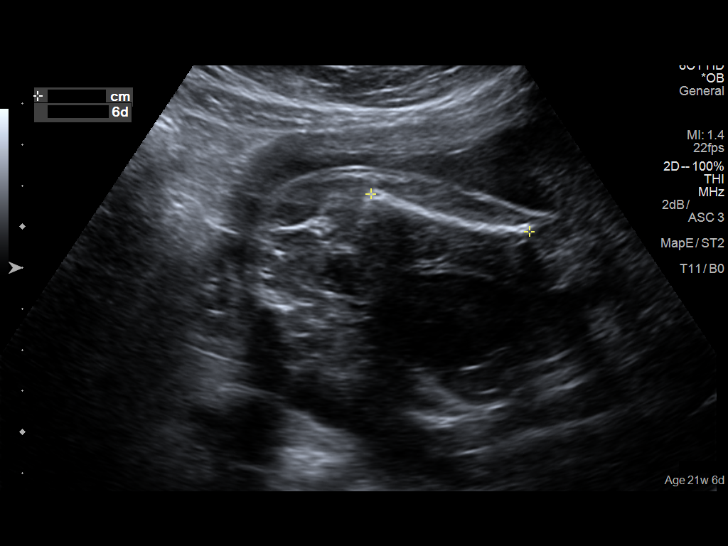
[im 9/10]
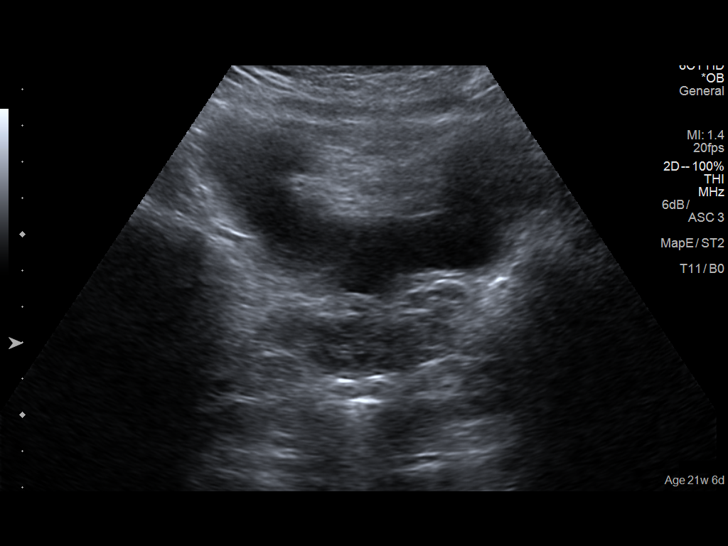
[im 10/10]
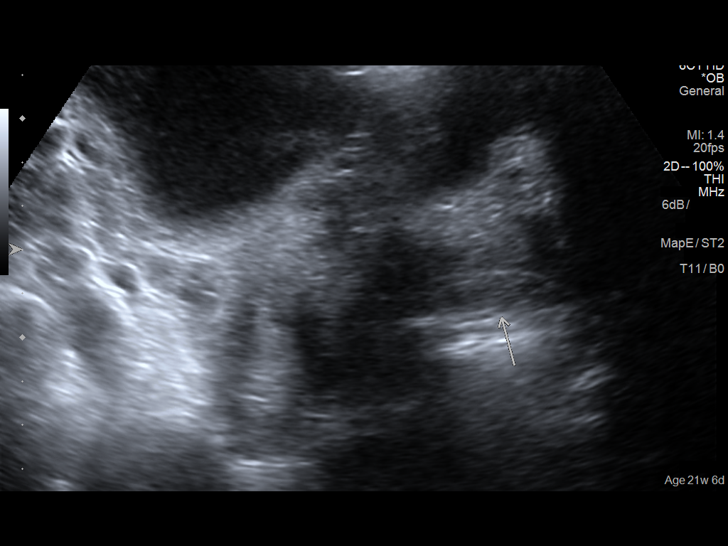

[10 of 10 positions shown; findings below may reference images not displayed]

FINDINGS: Number of Fetuses: 1

Heart Rate:  155 bpm

Movement: Yes

Presentation: Cephalic

Placental Location: Anterior

Previa: No.

Amniotic Fluid (Subjective):  Within normal limits.

BPD:  5.47cm 22w  5d

MATERNAL FINDINGS:

Cervix:  Appears closed.

Uterus/Adnexae: No abnormality visualized.
IMPRESSION: Single live intrauterine pregnancy gestational age 22 weeks 5 days,
estimated date of delivery 02/27/2017.

This exam is performed on an emergent basis and does not
comprehensively evaluate fetal size, dating, or anatomy; follow-up
complete OB US should be considered if further fetal assessment is
warranted.
